# Patient Record
Sex: Female | Born: 1965 | Race: White | Hispanic: No | Marital: Single | Smoking: Former smoker
Health system: Southern US, Community
[De-identification: ages and names within clinical notes are randomized; demographics above are authoritative.]

## PROBLEM LIST (undated history)

## (undated) DIAGNOSIS — F909 Attention-deficit hyperactivity disorder, unspecified type: Secondary | ICD-10-CM

## (undated) DIAGNOSIS — G43909 Migraine, unspecified, not intractable, without status migrainosus: Secondary | ICD-10-CM

## (undated) DIAGNOSIS — F419 Anxiety disorder, unspecified: Secondary | ICD-10-CM

## (undated) HISTORY — DX: Anxiety disorder, unspecified: F41.9

## (undated) HISTORY — PX: CHOLECYSTECTOMY: SHX55

## (undated) HISTORY — PX: GASTRIC BYPASS: SHX52

## (undated) HISTORY — PX: HERNIA REPAIR: SHX51

## (undated) HISTORY — DX: Attention-deficit hyperactivity disorder, unspecified type: F90.9

---

## 2004-03-03 ENCOUNTER — Other Ambulatory Visit: Payer: Self-pay

## 2005-04-22 ENCOUNTER — Ambulatory Visit: Payer: Self-pay | Admitting: General Practice

## 2006-05-18 ENCOUNTER — Ambulatory Visit: Payer: Self-pay | Admitting: General Practice

## 2008-06-30 ENCOUNTER — Inpatient Hospital Stay: Payer: Self-pay | Admitting: *Deleted

## 2010-03-27 ENCOUNTER — Emergency Department: Payer: Self-pay | Admitting: Emergency Medicine

## 2011-10-13 ENCOUNTER — Emergency Department: Payer: Self-pay | Admitting: Emergency Medicine

## 2011-10-13 LAB — VALPROIC ACID LEVEL: Valproic Acid: 57 ug/mL

## 2011-10-13 LAB — COMPREHENSIVE METABOLIC PANEL
Alkaline Phosphatase: 73 U/L (ref 50–136)
Bilirubin,Total: 0.3 mg/dL (ref 0.2–1.0)
Calcium, Total: 9.1 mg/dL (ref 8.5–10.1)
Co2: 28 mmol/L (ref 21–32)
Creatinine: 0.67 mg/dL (ref 0.60–1.30)
EGFR (African American): >60
EGFR (Non-African Amer.): >60
Glucose: 78 mg/dL (ref 65–99)
Osmolality: 275 (ref 275–301)
Potassium: 3.8 mmol/L (ref 3.5–5.1)
SGPT (ALT): 20 U/L

## 2011-10-13 LAB — CBC
HCT: 30.8 % — ABNORMAL LOW (ref 35.0–47.0)
HGB: 9.5 g/dL — ABNORMAL LOW (ref 12.0–16.0)
MCH: 22.7 pg — ABNORMAL LOW (ref 26.0–34.0)
MCHC: 30.9 g/dL — ABNORMAL LOW (ref 32.0–36.0)
MCV: 74 fL — ABNORMAL LOW (ref 80–100)
RBC: 4.2 10*6/uL (ref 3.80–5.20)
RDW: 18.7 % — ABNORMAL HIGH (ref 11.5–14.5)
WBC: 9.9 10*3/uL (ref 3.6–11.0)

## 2012-03-27 ENCOUNTER — Emergency Department: Payer: Self-pay | Admitting: Emergency Medicine

## 2012-03-27 LAB — BASIC METABOLIC PANEL
Anion Gap: 6 — ABNORMAL LOW (ref 7–16)
BUN: 9 mg/dL (ref 7–18)
Chloride: 107 mmol/L (ref 98–107)
Co2: 29 mmol/L (ref 21–32)
Creatinine: 0.75 mg/dL (ref 0.60–1.30)
EGFR (African American): >60
EGFR (Non-African Amer.): >60
Glucose: 91 mg/dL (ref 65–99)
Sodium: 142 mmol/L (ref 136–145)

## 2012-03-27 LAB — CBC
HCT: 31.5 % — ABNORMAL LOW (ref 35.0–47.0)
MCHC: 32.6 g/dL (ref 32.0–36.0)
MCV: 75 fL — ABNORMAL LOW (ref 80–100)

## 2013-08-18 ENCOUNTER — Ambulatory Visit: Payer: Self-pay | Admitting: Internal Medicine

## 2013-08-24 ENCOUNTER — Ambulatory Visit: Payer: Self-pay | Admitting: Emergency Medicine

## 2013-08-24 LAB — RAPID INFLUENZA A&B ANTIGENS

## 2014-08-24 ENCOUNTER — Ambulatory Visit: Payer: Self-pay | Admitting: Family Medicine

## 2015-02-28 ENCOUNTER — Encounter: Payer: Self-pay | Admitting: Emergency Medicine

## 2015-02-28 ENCOUNTER — Ambulatory Visit
Admission: EM | Admit: 2015-02-28 | Discharge: 2015-02-28 | Disposition: A | Discharge status: Home / Self Care | Payer: BLUE CROSS/BLUE SHIELD | Attending: Family Medicine | Admitting: Family Medicine

## 2015-02-28 DIAGNOSIS — H6593 Unspecified nonsuppurative otitis media, bilateral: Secondary | ICD-10-CM

## 2015-02-28 DIAGNOSIS — R51 Headache: Secondary | ICD-10-CM

## 2015-02-28 DIAGNOSIS — H539 Unspecified visual disturbance: Secondary | ICD-10-CM

## 2015-02-28 DIAGNOSIS — H5711 Ocular pain, right eye: Secondary | ICD-10-CM

## 2015-02-28 DIAGNOSIS — R519 Headache, unspecified: Secondary | ICD-10-CM

## 2015-02-28 HISTORY — DX: Migraine, unspecified, not intractable, without status migrainosus: G43.909

## 2015-02-28 NOTE — ED Provider Notes (Signed)
CSN: [id number]     Arrival date & time 02/28/15  1138 History   First MD Initiated Contact with Patient 02/28/15 1402     Chief Complaint  Patient presents with  . Headache  . 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  This headache not her usual; occipital and right eye hurts like pin pricks and started to see spots when she closes her eyes.  Tried extra strength tylenol 2 tabs every 4 hours yesterday and one dose today 0900.  [place].  Having dizzyness with position changes.  Denied seasonal allergies or recent illness.  Having nausea x 2 days  Patient is a 49 y.o. female presenting with headaches and eye pain. The history is provided by the patient.  Headache Pain location:  Occipital Quality:  Sharp Radiates to:  Does not radiate Onset quality:  Sudden Duration:  2 days Timing:  Constant Progression:  Waxing and waning Chronicity:  New Similar to prior headaches: no   Context: activity, bright light and loud noise   Context: not caffeine, not coughing, not defecating, not eating, not stress, not exposure to cold air, not intercourse and not straining   Relieved by:  Nothing Worsened by:  Activity, light and sound Ineffective treatments:  Acetaminophen Associated symptoms: dizziness, eye pain, nausea and photophobia   Associated symptoms: no abdominal pain, no back pain, no blurred vision, no congestion, no cough, no diarrhea, no drainage, no ear pain, no facial pain, no fatigue, no fever, no focal weakness, no hearing loss, no loss of balance, no myalgias, no near-syncope, no neck pain, no neck stiffness, no numbness, no paresthesias, no seizures, no sinus pressure, no sore throat, no swollen glands, no syncope, no tingling, no URI, no visual change, no vomiting and no weakness   Dizziness:    Severity:   Mild   Duration:  2 days   Timing:  Constant   Progression:  Unchanged Nausea:    Severity:  Mild   Onset quality:  Sudden   Duration:  2 days   Timing:  Constant   Progression:  Unchanged Risk factors: no anger, no family hx of SAH, does not have insomnia and lifestyle not sedentary   Eye Pain Associated symptoms include headaches. Pertinent negatives include no chest pain, no abdominal pain and no shortness of breath.    Past Medical History  Diagnosis Date  . Migraines    Past Surgical History  Procedure Laterality Date  . Gastric bypass    . Hernia repair     History reviewed. No pertinent family history. Social History  Substance Use Topics  . Smoking status: Former Games developer  . Smokeless tobacco: None  . Alcohol Use: No   OB History    No data available     Review of Systems  Constitutional: Negative for fever, chills, diaphoresis, activity change, appetite change and fatigue.  HENT: Negative for congestion, dental problem, drooling, ear discharge, ear pain, facial swelling, hearing loss, mouth sores, nosebleeds, postnasal drip, rhinorrhea, sinus pressure, sneezing, sore throat, tinnitus, trouble swallowing and voice change.   Eyes: Positive for photophobia and pain. Negative for blurred vision, discharge, redness, itching and visual disturbance.  Respiratory: Negative for cough, choking, chest tightness, shortness of breath, wheezing and stridor.   Cardiovascular: Negative for chest pain, leg swelling, syncope and near-syncope.  Gastrointestinal: Positive for nausea. Negative for vomiting, abdominal pain, diarrhea, constipation,  blood in stool, abdominal distention, anal bleeding and rectal pain.  Endocrine: Negative for cold intolerance and heat intolerance.  Genitourinary: Negative for dysuria, frequency, flank pain and enuresis.  Musculoskeletal: Negative for myalgias, back pain, joint swelling, arthralgias, gait problem, neck pain and neck stiffness.  Skin:  Negative for color change, pallor, rash and wound.  Allergic/Immunologic: Negative for environmental allergies and food allergies.  Neurological: Positive for dizziness and headaches. Negative for tremors, focal weakness, seizures, syncope, facial asymmetry, speech difficulty, weakness, light-headedness, numbness, paresthesias and loss of balance.  Hematological: Negative for adenopathy. Does not bruise/bleed easily.  Psychiatric/Behavioral: Negative for behavioral problems, confusion, sleep disturbance and agitation.    Allergies  Review of patient's allergies indicates no known allergies.  Home Medications   Prior to Admission medications   Not on File   Meds Ordered and Administered this Visit  Medications - No data to display  BP 133/77 mmHg  Pulse 61  Temp(Src) 97.5 F (36.4 C) (Tympanic)  Resp 16  Ht 5' (1.524 m)  Wt 130 lb (58.968 kg)  BMI 25.39 kg/m2  SpO2 99%  LMP 02/07/2015 (Approximate) Orthostatic VS for the past 24 hrs:  BP- Lying Pulse- Lying BP- Sitting Pulse- Sitting BP- Standing at 0 minutes Pulse- Standing at 0 minutes  02/28/15 1427 143/75 mmHg 62 152/74 mmHg 61 159/75 mmHg 64    Physical Exam  Constitutional: She is oriented to person, place, and time. Vital signs are normal. She appears well-developed and well-nourished. No distress.  HENT:  Head: Normocephalic and atraumatic.  Right Ear: Hearing, external ear and ear canal normal. A middle ear effusion is present.  Left Ear: Hearing, external ear and ear canal normal. A middle ear effusion is present.  Nose: Nose normal. No mucosal edema, rhinorrhea, nose lacerations, sinus tenderness, nasal deformity, septal deviation or nasal septal hematoma. No epistaxis.  No foreign bodies. Right sinus exhibits no maxillary sinus tenderness and no frontal sinus tenderness. Left sinus exhibits no maxillary sinus tenderness and no frontal sinus tenderness.  Mouth/Throat: Uvula is midline. Mucous membranes are not pale,  not dry and not cyanotic. She does not have dentures. No oral lesions. No trismus in the jaw. Normal dentition. No dental abscesses, uvula swelling, lacerations or dental caries. Posterior oropharyngeal edema and posterior oropharyngeal erythema present. No oropharyngeal exudate or tonsillar abscesses.  Bilateral TMs with air fluid level clear; cobblestoning posterior pharynx  Eyes: Conjunctivae, EOM and lids are normal. Pupils are equal, round, and reactive to light. Right eye exhibits no discharge. Left eye exhibits no discharge. No scleral icterus.  Neck: Trachea normal and normal range of motion. Neck supple. No tracheal deviation present.  Cardiovascular: Normal rate, regular rhythm, normal heart sounds and intact distal pulses.  Exam reveals no gallop and no friction rub.   No murmur heard. Pulmonary/Chest: Effort normal and breath sounds normal. No stridor. No respiratory distress. She has no wheezes. She has no rales. She exhibits no tenderness.  Abdominal: Soft. Bowel sounds are normal. She exhibits no distension and no mass. There is no tenderness. There is no rebound and no guarding.  Musculoskeletal: Normal range of motion. She exhibits no edema or tenderness.  Lymphadenopathy:    She has no cervical adenopathy.  Neurological: She is alert and oriented to person, place, and time. She exhibits normal muscle tone. Coordination normal.  Skin: Skin is warm, dry and intact. No rash noted. She is not diaphoretic. No erythema. No pallor.  Psychiatric: She has a normal mood and affect.  Her speech is normal and behavior is normal. Judgment and thought content normal. Cognition and memory are normal.  Nursing note and vitals reviewed.   ED Course  Procedures (including critical care time)  Labs Review Labs Reviewed - No data to display  Imaging Review No results found.  1415 Discussed orthostatics to be performed by nurse; consider meclizine or toradol and zofran ODT .  Patient refused  antinausea medication at this time.  Discussed fluid in ears can cause dizzyness with position changes.  Awaiting orthostatic blood pressure results.  Patient verbalized understanding of information and had no further questions at this time.   1445 not orthostatic but excruciating stabbing right eye pain and dizzyness with lying to sitting.  Case discussed with Dr Thurmond Butts and opthalmologic exam recommended.  On call Dr Inez Pilgrim contacted and will see patient now in [place] office at [address] near [place].  [facility] DVA 20/25 OD, OS and OU.  Patient reported she is still seeing black spots in her right eye when she closes eyelid and with position changes worsens.  Exitcare handout on visual disturbance given to patient.   Discussed with patient her differential diagnosis include retinal artery inflammation or occlusion, increased eye pressure, primary stabbing headache.  Patient agreed to go to Dr Skip Estimable office now via POV.  Patient verbalized understanding of information/instructions, agreed with plan of care and had no further questions at this time. MDM   1. Eye pain, right   2. Visual disturbance of one eye   3. Acute intractable headache, unspecified headache type   4. Otitis media with effusion, bilateral    For acute pain, rest, and intermittent application of heat, analgesics, and PRN po NSAIDS.  Discussed maximum tylenol dosing  po QID.  Discussed may trial toradol injection  IM in clinic if cleared by opthalmology.  [facility] closes at 8pm.  Do not exceed dosing as liver injury can occur.  Avoid known triggers e.g. sleep deprivation, foods, stress, dehydration.  If headache is the worst headache of entire life and came on like a clap of thunder patient was instructed to go to the Emergency Room.  Call or return to clinic as needed if these symptoms worsen or fail to improve as anticipated.  Exitcare handout on headaches given to patient and patient also instructed to  maintain headache log.  Patient verbalized agreement and understanding of treatment plan. P2:  Diet and fitness  Supportive treatment.   No evidence of invasive bacterial infection, non toxic and well hydrated.  This is most likely self limiting viral infection.  I do not see where any further testing or imaging is necessary at this time.   I will suggest supportive care, rest, good hygiene and encourage the patient to take adequate fluids.  The patient is to return to clinic or EMERGENCY ROOM if symptoms worsen or change significantly e.g. ear pain, fever, purulent discharge from ears or bleeding.  Exitcare handout on otitis media with effusion given to patient.  Patient verbalized agreement and understanding of treatment plan.    Discussed with patient otitis media with effusion can cause dizzyness.  Meclizine  po trial could be performed.  Supportive treatment may take up to 4 doses meclizine per day max  per 24 hours.  Discussed signs/symptoms stroke.    Follow up if aphasia, dysphasia, visual changes, weakness, fall, worst headache of life, incoordination, fever, ear discharge.  Consider ENT evaluation/follow up with PCM if worsening symptoms not controlled with meclizine or  needs Rx refill.  Patient verbalized understanding of information/agreed with plan of care and had no further questions at this time.   Barbaraann Barthel, NP 02/28/15 1513

## 2015-02-28 NOTE — ED Notes (Signed)
Patient c/o headache and right eye pain for two days.  Patient reports nausea.

## 2015-02-28 NOTE — Discharge Instructions (Signed)
Tension Headache A tension headache is a feeling of pain, pressure, or aching often felt over the front and sides of the head. The pain can be dull or can feel tight (constricting). It is the most common type of headache. Tension headaches are not normally associated with nausea or vomiting and do not get worse with physical activity. Tension headaches can last 30 minutes to several days.  CAUSES  The exact cause is not known, but it may be caused by chemicals and hormones in the brain that lead to pain. Tension headaches often begin after stress, anxiety, or depression. Other triggers may include:  Alcohol.  Caffeine (too much or withdrawal).  Respiratory infections (colds, flu, sinus infections).  Dental problems or teeth clenching.  Fatigue.  Holding your head and neck in one position too long while using a computer. SYMPTOMS   Pressure around the head.   Dull, aching head pain.   Pain felt over the front and sides of the head.   Tenderness in the muscles of the head, neck, and shoulders. DIAGNOSIS  A tension headache is often diagnosed based on:   Symptoms.   Physical examination.   A CT scan or MRI of your head. These tests may be ordered if symptoms are severe or unusual. TREATMENT  Medicines may be given to help relieve symptoms.  HOME CARE INSTRUCTIONS   Only take over-the-counter or prescription medicines for pain or discomfort as directed by your caregiver.   Lie down in a dark, quiet room when you have a headache.   Keep a journal to find out what may be triggering your headaches. For example, write down:  What you eat and drink.  How much sleep you get.  Any change to your diet or medicines.  Try massage or other relaxation techniques.   Ice packs or heat applied to the head and neck can be used. Use these 3 to 4 times per day for 15 to 20 minutes each time, or as needed.   Limit stress.   Sit up straight, and do not tense your muscles.    Quit smoking if you smoke.  Limit alcohol use.  Decrease the amount of caffeine you drink, or stop drinking caffeine.  Eat and exercise regularly.  Get 7 to 9 hours of sleep, or as recommended by your caregiver.  Avoid excessive use of pain medicine as recurrent headaches can occur.  SEEK MEDICAL CARE IF:   You have problems with the medicines you were prescribed.  Your medicines do not work.  You have a change from the usual headache.  You have nausea or vomiting. SEEK IMMEDIATE MEDICAL CARE IF:   Your headache becomes severe.  You have a fever.  You have a stiff neck.  You have loss of vision.  You have muscular weakness or loss of muscle control.  You lose your balance or have trouble walking.  You feel faint or pass out.  You have severe symptoms that are different from your first symptoms. MAKE SURE YOU:   Understand these instructions.  Will watch your condition.  Will get help right away if you are not doing well or get worse. Document Released: 06/08/2005 Document Revised: 08/31/2011 Document Reviewed: 05/29/2011 [facility] Patient Information 2015 [place], [place]. This information is not intended to replace advice given to you by your health care provider. Make sure you discuss any questions you have with your health care provider. Visual Disturbances You have had a disturbance in your vision. This may be  caused by various conditions, such as:  Migraines. Migraine headaches are often preceded by a disturbance in vision. Blind spots or light flashes are followed by a headache. This type of visual disturbance is temporary. It does not damage the eye.  Glaucoma. This is caused by increased pressure in the eye. Symptoms include haziness, blurred vision, or seeing rainbow colored circles when looking at bright lights. Partial or complete visual loss can occur. You may or may not experience eye pain. Visual loss may be gradual or sudden and is irreversible.  Glaucoma is the leading cause of blindness.  Retina problems. Vision will be reduced if the retina becomes detached or if there is a circulation problem as with diabetes, high blood pressure, or a mini-stroke. Symptoms include seeing "floaters," flashes of light, or shadows, as if a curtain has fallen over your eye.  Optic nerve problems. The main nerve in your eye can be damaged by redness, soreness, and swelling (inflammation), poor circulation, drugs, and toxins. It is very important to have a complete exam done by a specialist to determine the exact cause of your eye problem. The specialist may recommend medicines or surgery, depending on the cause of the problem. This can help prevent further loss of vision or reduce the risk of having a stroke. Contact the caregiver to whom you have been referred and arrange for follow-up care right away. SEEK IMMEDIATE MEDICAL CARE IF:   Your vision gets worse.  You develop severe headaches.  You have any weakness or numbness in the face, arms, or legs.  You have any trouble speaking or walking. Document Released: 07/16/2004 Document Revised: 08/31/2011 Document Reviewed: 11/06/2009 [facility] Patient Information 2015 [place], [place]. This information is not intended to replace advice given to you by your health care provider. Make sure you discuss any questions you have with your health care provider. Otitis Media With Effusion Otitis media with effusion is the presence of fluid in the middle ear. This is a common problem in children, which often follows ear infections. It may be present for weeks or longer after the infection. Unlike an acute ear infection, otitis media with effusion refers only to fluid behind the ear drum and not infection. Children with repeated ear and sinus infections and allergy problems are the most likely to get otitis media with effusion. CAUSES  The most frequent cause of the fluid buildup is dysfunction of the eustachian tubes.  These are the tubes that drain fluid in the ears to the back of the nose (nasopharynx). SYMPTOMS   The main symptom of this condition is hearing loss. As a result, you or your child may:  Listen to the TV at a loud volume.  Not respond to questions.  Ask "what" often when spoken to.  Mistake or confuse one sound or word for another.  There may be a sensation of fullness or pressure but usually not pain. DIAGNOSIS   Your health care provider will diagnose this condition by examining you or your child's ears.  Your health care provider may test the pressure in you or your child's ear with a tympanometer.  A hearing test may be conducted if the problem persists. TREATMENT   Treatment depends on the duration and the effects of the effusion.  Antibiotics, decongestants, nose drops, and cortisone-type drugs (tablets or nasal spray) may not be helpful.  Children with persistent ear effusions may have delayed language or behavioral problems. Children at risk for developmental delays in hearing, learning, and speech may  require referral to a specialist earlier than children not at risk.  You or your child's health care provider may suggest a referral to an ear, nose, and throat surgeon for treatment. The following may help restore normal hearing:  Drainage of fluid.  Placement of ear tubes (tympanostomy tubes).  Removal of adenoids (adenoidectomy). HOME CARE INSTRUCTIONS   Avoid secondhand smoke.  Infants who are breastfed are less likely to have this condition.  Avoid feeding infants while they are lying flat.  Avoid known environmental allergens.  Avoid people who are sick. SEEK MEDICAL CARE IF:   Hearing is not better in 3 months.  Hearing is worse.  Ear pain.  Drainage from the ear.  Dizziness. MAKE SURE YOU:   Understand these instructions.  Will watch your condition.  Will get help right away if you are not doing well or get worse. Document Released:  07/16/2004 Document Revised: 10/23/2013 Document Reviewed: 01/03/2013 [facility] Patient Information 2015 [place], [place]. This information is not intended to replace advice given to you by your health care provider. Make sure you discuss any questions you have with your health care provider.

## 2015-07-08 IMAGING — CR DG CHEST 2V
1 series · 2 of 2 positions shown · non-contrast
Comparison: One-view chest 06/30/2008

CLINICAL DATA: Cough and congestion.  Fever.

EXAM:
CHEST  2 VIEW

[Series 1: pa · 0.17mm/px · 2 of 2 slices shown]
[im 1/2]
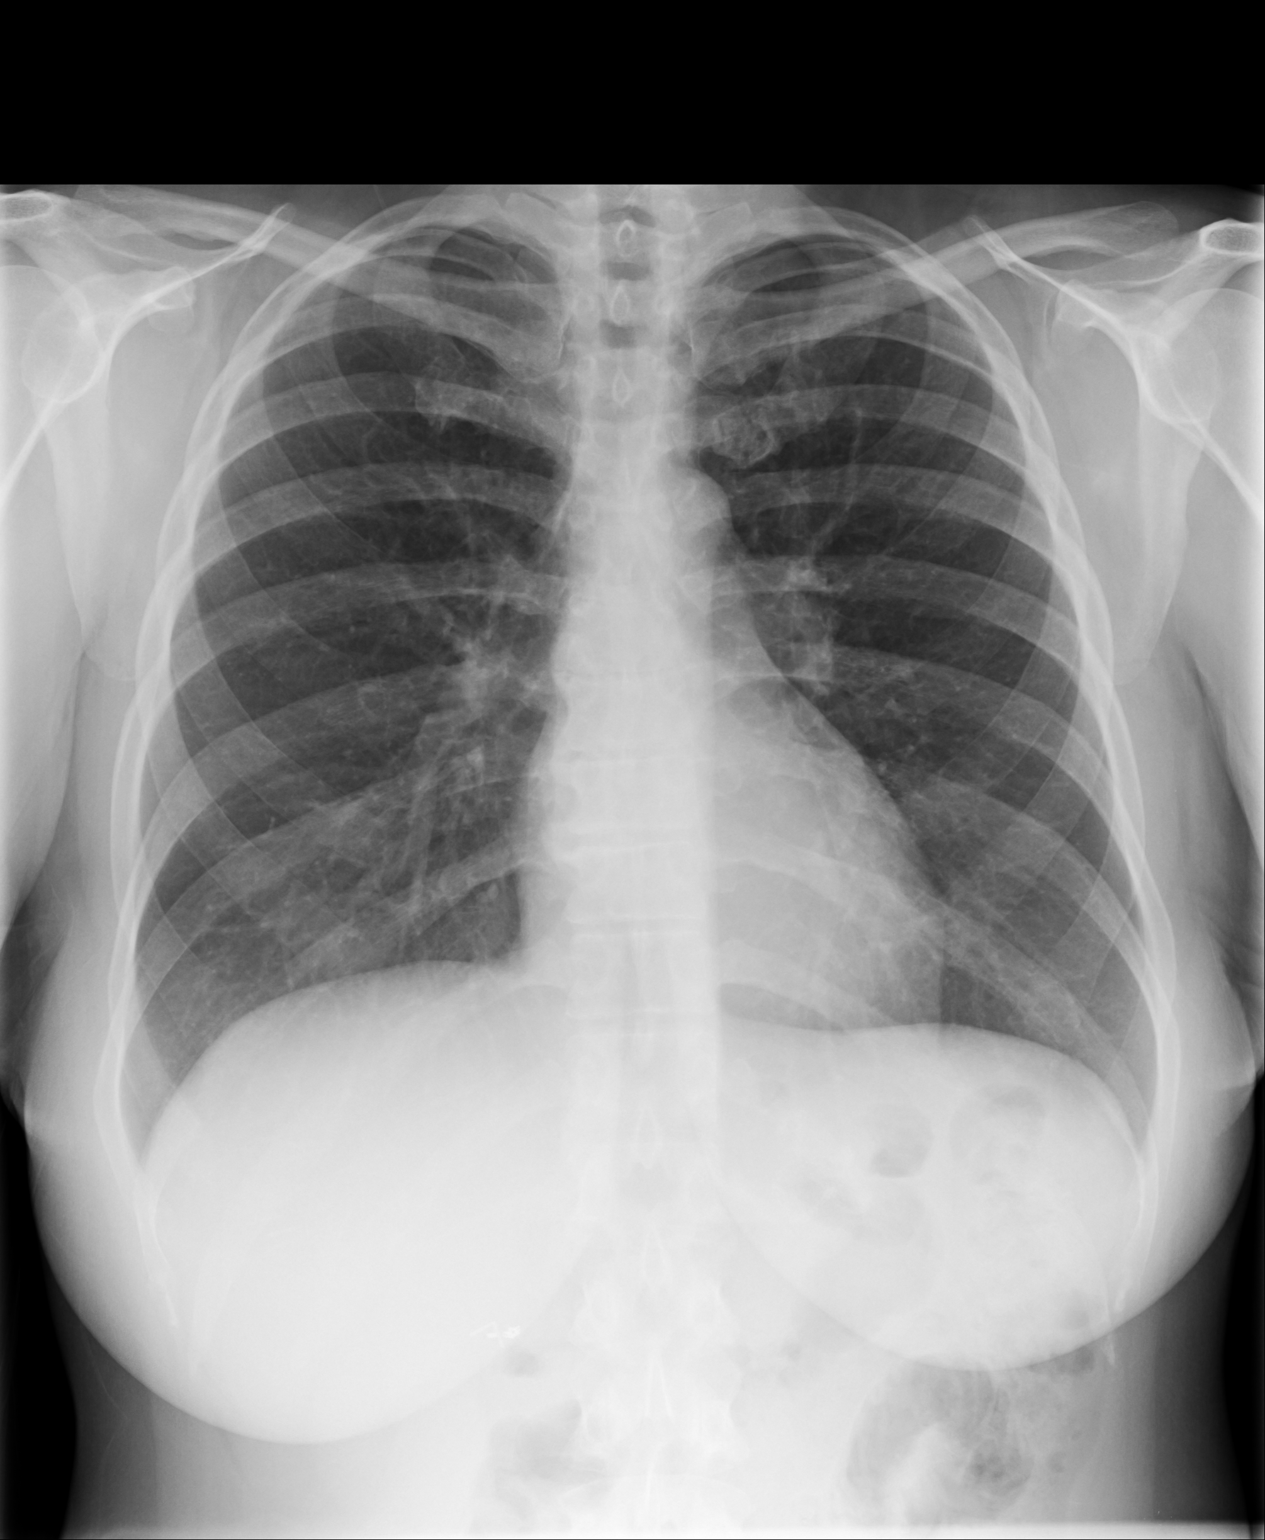
[im 2/2]
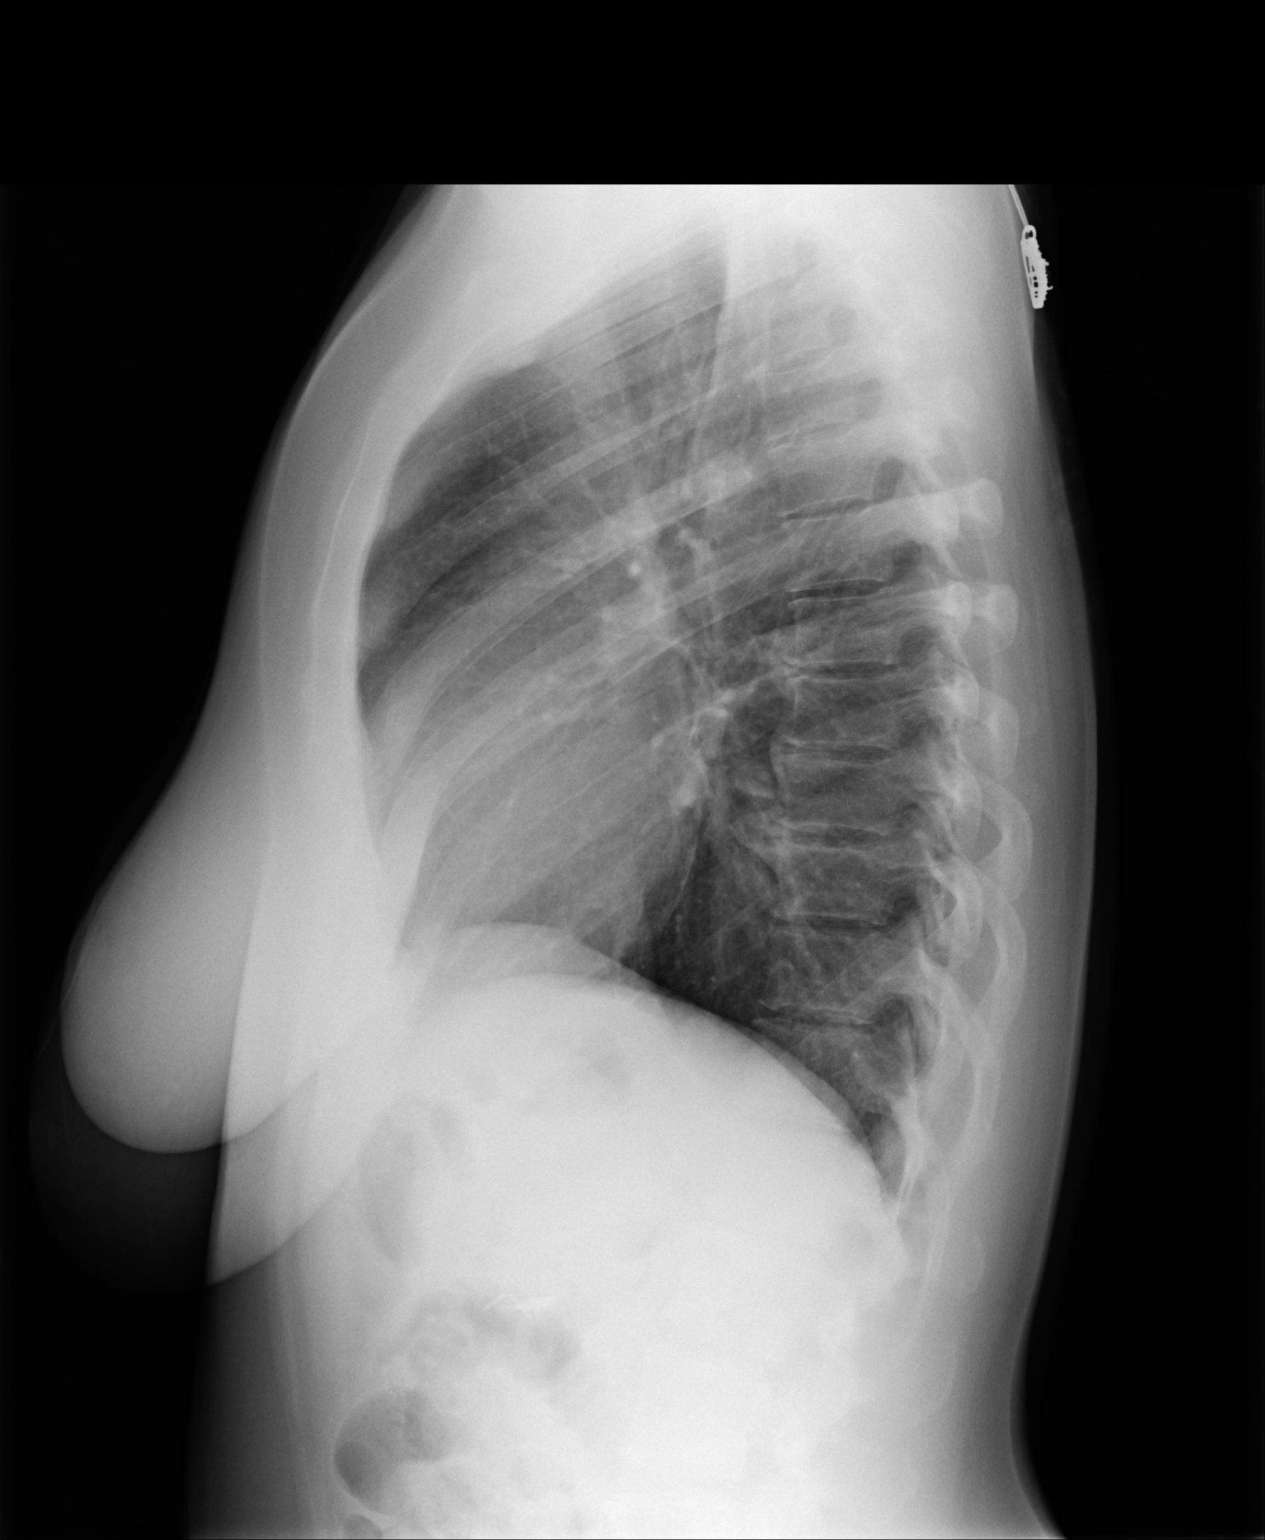

[2 of 2 positions shown; findings below may reference images not displayed]

FINDINGS: The heart size is normal. The lungs are clear. The visualized soft
tissues and bony thorax are unremarkable.
IMPRESSION: Negative two view chest.

## 2016-04-03 ENCOUNTER — Ambulatory Visit (INDEPENDENT_AMBULATORY_CARE_PROVIDER_SITE_OTHER): Payer: BLUE CROSS/BLUE SHIELD | Admitting: Family Medicine

## 2016-04-03 ENCOUNTER — Encounter: Payer: Self-pay | Admitting: Family Medicine

## 2016-04-03 VITALS — BP 130/70 | HR 72 | Ht 60.0 in | Wt 144.0 lb

## 2016-04-03 DIAGNOSIS — Z7689 Persons encountering health services in other specified circumstances: Secondary | ICD-10-CM

## 2016-04-03 NOTE — Progress Notes (Signed)
Name: Kim Parker   MRN: [id number]    DOB: [birth year]   Date:04/03/2016       Progress Note  Subjective  Chief Complaint  Chief Complaint  Patient presents with  . Establish Care    hard to go to [facility] care    Patient to establish care with new patient.    No problem-specific Assessment & Plan notes found for this encounter.   Past Medical History:  Diagnosis Date  . ADHD   . Anxiety   . Migraines     Past Surgical History:  Procedure Laterality Date  . CHOLECYSTECTOMY    . GASTRIC BYPASS    . HERNIA REPAIR      Family History  Problem Relation Age of Onset  . Heart disease Father   . Cancer Maternal Grandmother     Social History   Social History  . Marital status: Single    Spouse name: N/A  . Number of children: N/A  . Years of education: N/A   Occupational History  . Not on file.   Social History Main Topics  . Smoking status: Former Games developer  . Smokeless tobacco: Never Used  . Alcohol use No  . Drug use: No  . Sexual activity: Yes   Other Topics Concern  . Not on file   Social History Narrative  . No narrative on file    No Known Allergies   Review of Systems  Constitutional: Negative for chills, diaphoresis, fever, malaise/fatigue and weight loss.  HENT: Negative for ear discharge, ear pain and sore throat.   Eyes: Negative for blurred vision.  Respiratory: Negative for cough, sputum production, shortness of breath and wheezing.   Cardiovascular: Negative for chest pain, palpitations and leg swelling.  Gastrointestinal: Negative for abdominal pain, blood in stool, constipation, diarrhea, heartburn, melena and nausea.  Genitourinary: Negative for dysuria, frequency, hematuria and urgency.  Musculoskeletal: Negative for back pain, joint pain, myalgias and neck pain.  Skin: Negative for rash.  Neurological: Negative for dizziness, tingling, sensory change, focal weakness, weakness and headaches.  Endo/Heme/Allergies:  Negative for environmental allergies and polydipsia. Does not bruise/bleed easily.  Psychiatric/Behavioral: Negative for depression and suicidal ideas. The patient is not nervous/anxious and does not have insomnia.      Objective  Vitals:   04/03/16 1502  BP: 130/70  Pulse: 72  Weight: 144 lb (65.3 kg)  Height: 5' (1.524 m)    Physical Exam  Constitutional: She is well-developed, well-nourished, and in no distress. No distress.  HENT:  Head: Normocephalic and atraumatic.  Right Ear: External ear normal.  Left Ear: External ear normal.  Nose: Nose normal.  Mouth/Throat: Oropharynx is clear and moist.  Eyes: Conjunctivae and EOM are normal. Pupils are equal, round, and reactive to light. Right eye exhibits no discharge. Left eye exhibits no discharge.  Neck: Normal range of motion. Neck supple. No JVD present. No thyromegaly present.  Cardiovascular: Normal rate, regular rhythm, normal heart sounds and intact distal pulses.  Exam reveals no gallop and no friction rub.   No murmur heard. Pulmonary/Chest: Effort normal and breath sounds normal.  Abdominal: Soft. Bowel sounds are normal. She exhibits no mass. There is no tenderness. There is no guarding.  Musculoskeletal: Normal range of motion. She exhibits no edema.  Lymphadenopathy:    She has no cervical adenopathy.  Neurological: She is alert. She has normal reflexes.  Skin: Skin is warm and dry. She is not diaphoretic.  Psychiatric: Mood and affect  normal.  Nursing note and vitals reviewed.     Assessment & Plan  Problem List Items Addressed This Visit    None    Visit Diagnoses    Encounter to establish care    -  Primary        [facility]anna [facility]  04/03/16
